# Patient Record
Sex: Female | Born: 1975 | Race: Black or African American | Hispanic: No | Marital: Single | Smoking: Never smoker
Health system: Southern US, Community
[De-identification: ages and names within clinical notes are randomized; demographics above are authoritative.]

---

## 2012-10-15 ENCOUNTER — Ambulatory Visit: Payer: Self-pay | Admitting: Unknown Physician Specialty

## 2012-10-31 ENCOUNTER — Ambulatory Visit: Payer: Self-pay | Admitting: Unknown Physician Specialty

## 2014-06-12 ENCOUNTER — Ambulatory Visit: Payer: Self-pay

## 2016-07-31 ENCOUNTER — Ambulatory Visit: Payer: Self-pay | Admitting: Physician Assistant

## 2016-07-31 VITALS — BP 110/70 | HR 86 | Temp 98.3°F

## 2016-07-31 DIAGNOSIS — N390 Urinary tract infection, site not specified: Secondary | ICD-10-CM

## 2016-07-31 DIAGNOSIS — R1032 Left lower quadrant pain: Secondary | ICD-10-CM

## 2016-07-31 MED ORDER — CIPROFLOXACIN HCL 250 MG PO TABS: 250.0000 mg | ORAL_TABLET | Freq: Two times a day (BID) | ORAL | 0 refills | Status: AC

## 2016-07-31 NOTE — Progress Notes (Signed)
S: c/o llq pain, no v/d, no fever/chills, no uti sx, no hx kidney stones, states pain started last night, worse with bending over, was a sharp stabbing pain when put pressure on this area, lmp was 1.5 weeks ago  O: vitals wnl, nad, lungs c t a, cv rrr, abd soft tender in llq closer to bladder/ovary, bs normal all 4 quads, ua 1+ leuks  A: uti  P: cipro 250mg  bid x 7d, if pain is increasing consider us of pelvis for ovarian cyst

## 2017-05-23 ENCOUNTER — Encounter: Payer: Self-pay | Admitting: Physician Assistant

## 2017-05-23 ENCOUNTER — Ambulatory Visit: Payer: Self-pay | Admitting: Physician Assistant

## 2017-05-23 VITALS — BP 120/82 | HR 73 | Temp 98.6°F

## 2017-05-23 DIAGNOSIS — J040 Acute laryngitis: Secondary | ICD-10-CM

## 2017-05-23 DIAGNOSIS — J069 Acute upper respiratory infection, unspecified: Secondary | ICD-10-CM

## 2017-05-23 MED ORDER — FLUTICASONE PROPIONATE 50 MCG/ACT NA SUSP: 2.0000 | Freq: Every day | NASAL | 6 refills | Status: AC

## 2017-05-23 MED ORDER — BENZONATATE 200 MG PO CAPS: 200.0000 mg | ORAL_CAPSULE | Freq: Three times a day (TID) | ORAL | 0 refills | Status: AC | PRN

## 2017-05-23 NOTE — Progress Notes (Signed)
S: Patient complains of a coarse voice, dry cough, no mucous production no sinus pain or drainage, denies fever or chills, denies chest pain or shortness of breath tried Alka-Seltzer plus over-the-counter without relief  O: Vitals are normal, voice is hoarse, TMs are clear nasal mucosa was swollen, throat is normal, neck is supple no lymph noted, lungs are clear to auscultation heart with normal heart sounds regular rate and rhythm  A: laryngitis, viral uri  P:flonase, tessalon perls, patient is to call the clinic and she is worsening by Monday and we will call in an antibiotic

## 2017-08-02 ENCOUNTER — Other Ambulatory Visit: Payer: Self-pay | Admitting: Unknown Physician Specialty

## 2017-08-02 DIAGNOSIS — M25562 Pain in left knee: Secondary | ICD-10-CM

## 2017-08-12 ENCOUNTER — Ambulatory Visit
Admission: RE | Admit: 2017-08-12 | Discharge: 2017-08-12 | Disposition: A | Discharge status: Home / Self Care | Payer: Managed Care, Other (non HMO) | Source: Ambulatory Visit | Attending: Unknown Physician Specialty | Admitting: Unknown Physician Specialty

## 2017-08-12 ENCOUNTER — Encounter: Payer: Self-pay | Admitting: Radiology

## 2017-08-12 DIAGNOSIS — M1712 Unilateral primary osteoarthritis, left knee: Secondary | ICD-10-CM | POA: Diagnosis not present

## 2017-08-12 DIAGNOSIS — M25562 Pain in left knee: Secondary | ICD-10-CM | POA: Insufficient documentation

## 2019-09-27 IMAGING — MR MR KNEE*L* W/O CM
6 series · 36 of 40 positions shown · non-contrast
Comparison: MRI 10/15/2012.

CLINICAL DATA: Suprapatellar pain for 1 month with intermittent
swelling. History ACL reconstruction.

EXAM:
MRI OF THE LEFT KNEE WITHOUT CONTRAST
TECHNIQUE: Multiplanar, multisequence MR imaging of the knee was performed. No
intravenous contrast was administered.

[Series 3: PD fat-sat · axial · 3.0mm · 0.50mm/px · z∈[-72,+46]mm · 8 of 37 slices shown (1 of 4)]
[im 1/37]
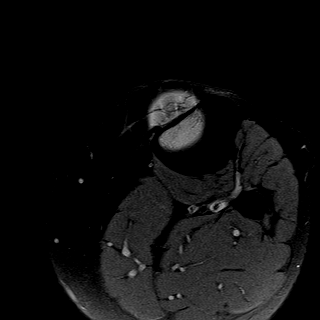
[im 6/37]
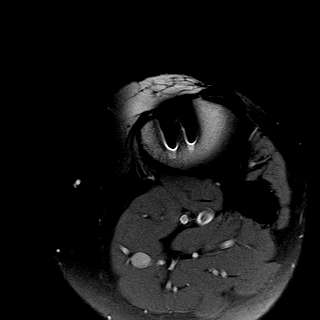
[im 11/37]
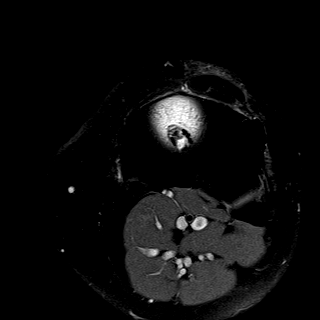
[im 16/37]
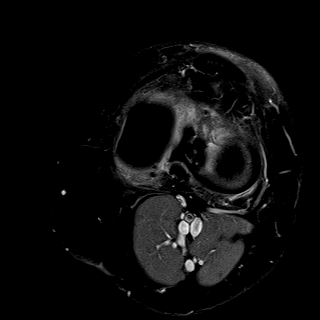
[im 21/37]
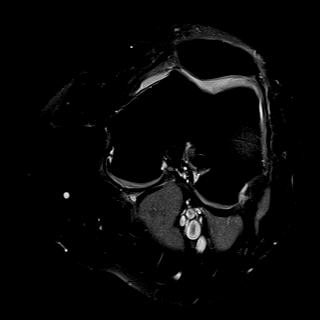
[im 26/37]
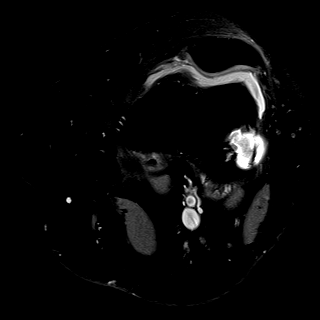
[im 31/37]
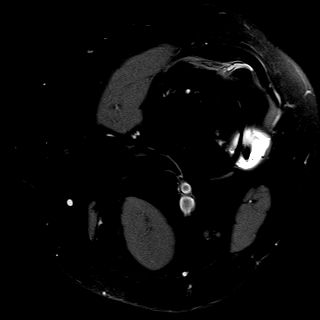
[im 37/37]
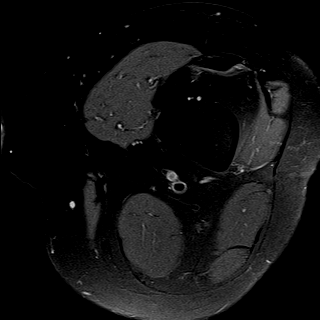

[Series 4: T1 · coronal · 3.0mm · 0.50mm/px · 3 of 31 slices shown]
[im 1/31]
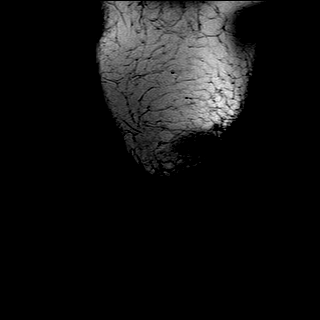
[im 6/31]
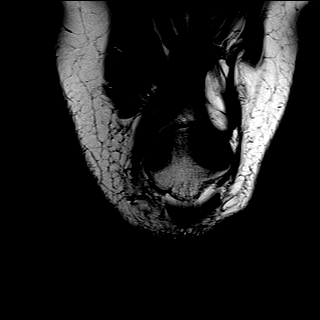
[im 11/31]
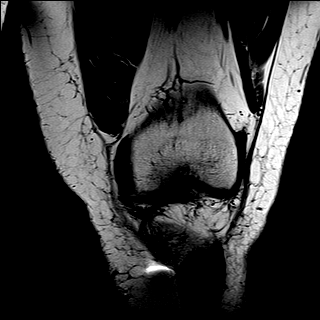

[Series 5: PD fat-sat · sagittal · 3.0mm · 0.50mm/px · 7 of 32 slices shown (2 of 4)]
[im 1/32]
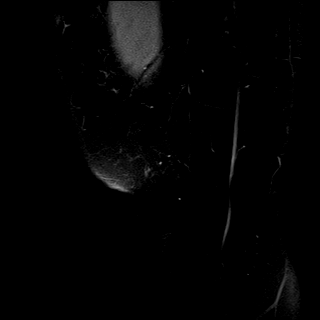
[im 6/32]
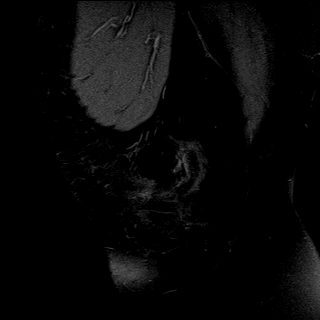
[im 11/32]
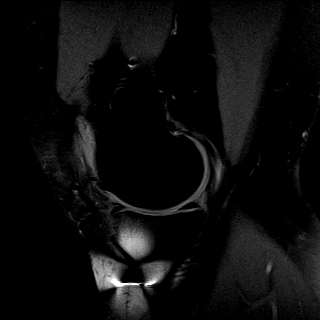
[im 16/32]
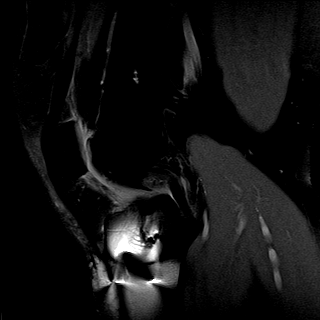
[im 21/32]
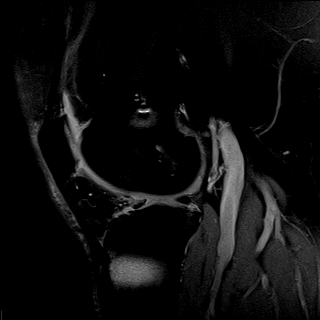
[im 26/32]
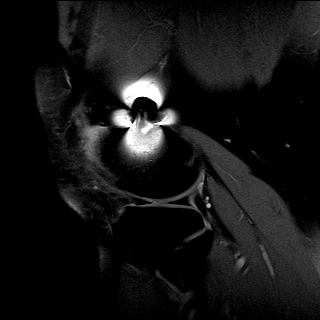
[im 32/32]
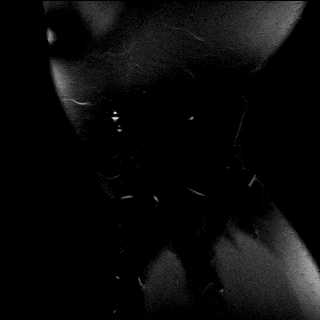

[Series 6: T2 fat-sat · coronal · 3.0mm · 0.31mm/px · 7 of 31 slices shown]
[im 1/31]
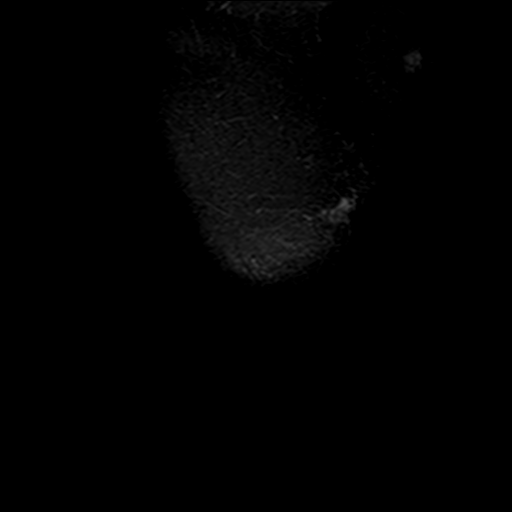
[im 6/31]
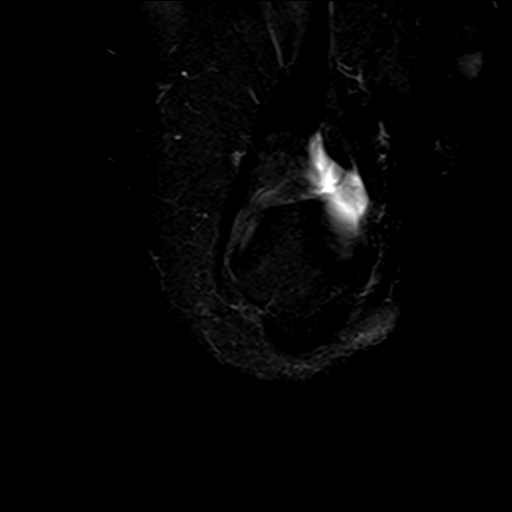
[im 11/31]
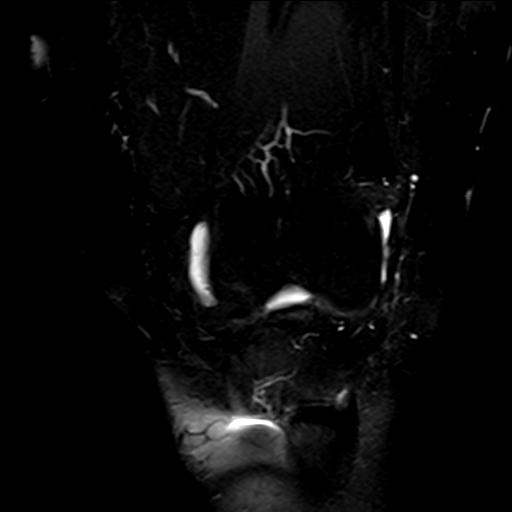
[im 16/31]
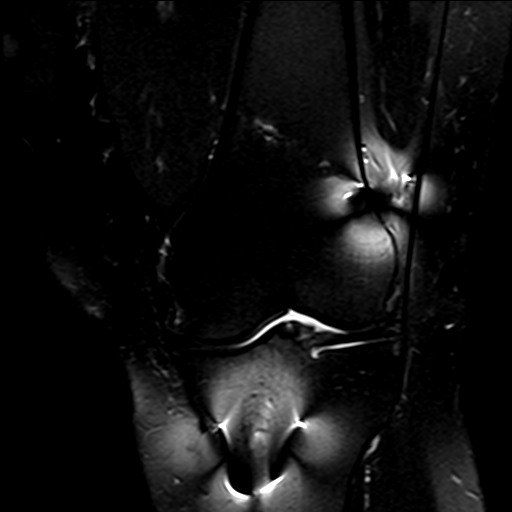
[im 21/31]
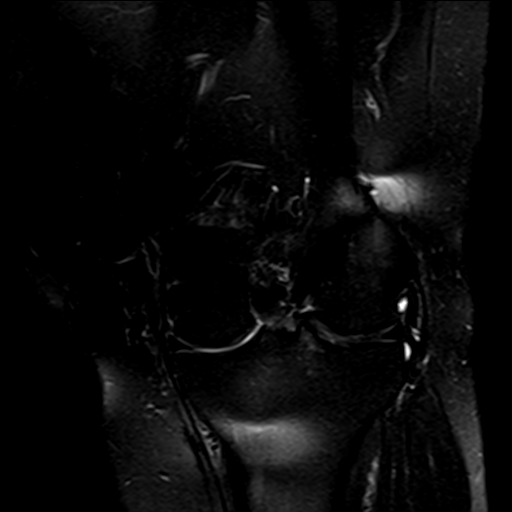
[im 26/31]
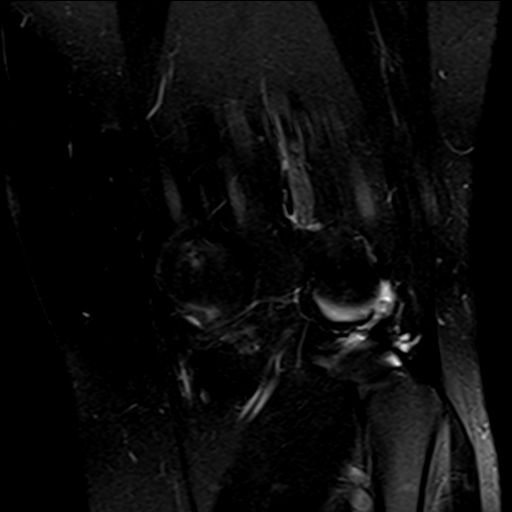
[im 31/31]
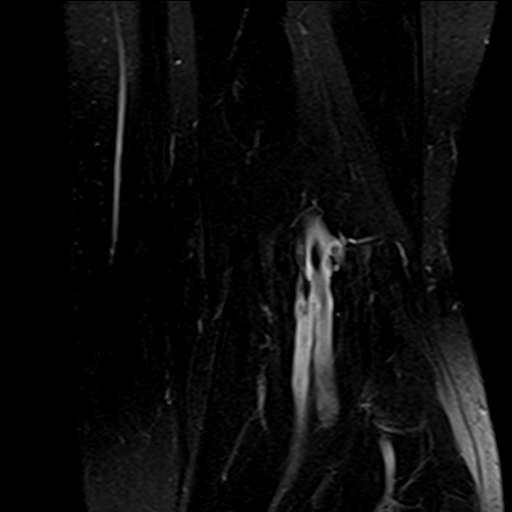

[Series 7: PD fat-sat · coronal · 3.0mm · 0.50mm/px · 7 of 31 slices shown (3 of 4)]
[im 1/31]
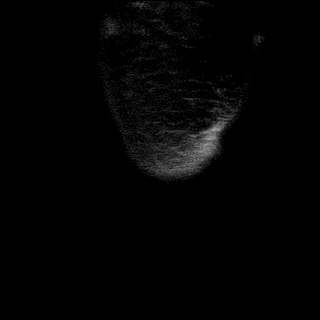
[im 6/31]
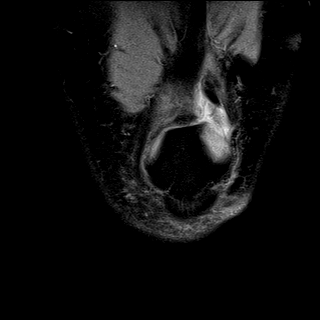
[im 11/31]
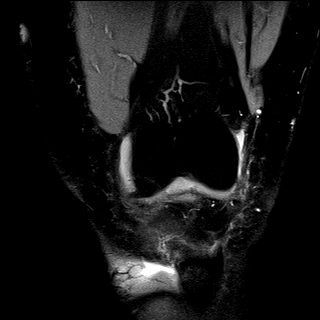
[im 16/31]
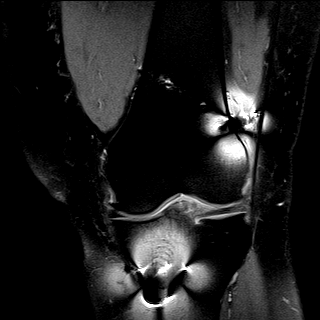
[im 21/31]
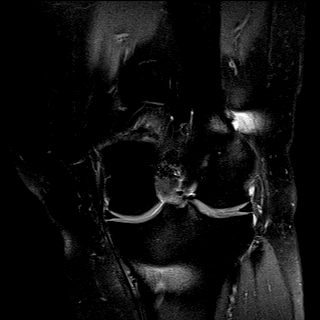
[im 26/31]
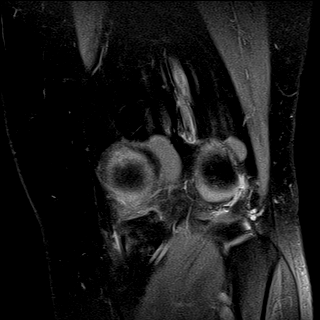
[im 31/31]
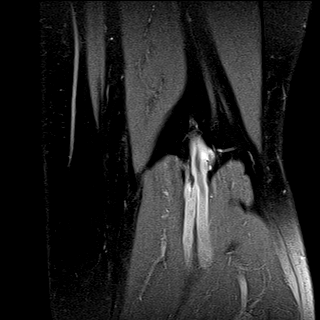

[Series 8: PD fat-sat · coronal · 2.0mm · 0.62mm/px · 4 of 18 slices shown (4 of 4)]
[im 1/18]
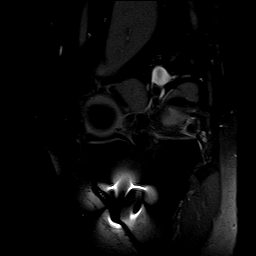
[im 6/18]
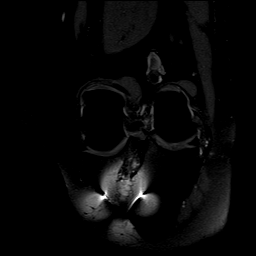
[im 12/18]
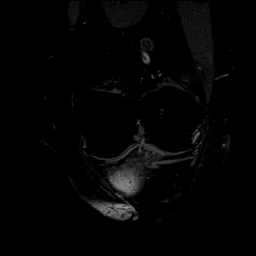
[im 18/18]
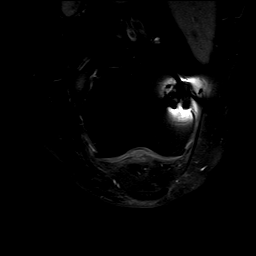

[36 of 40 positions shown; findings below may reference images not displayed]

FINDINGS: MENISCI

Medial meniscus:  Intact with normal morphology.

Lateral meniscus:  Intact with normal morphology.

LIGAMENTS

Cruciates: Status post ACL reconstruction. The graft is intact
without evidence roof impingement. Small tibial tunnel cyst has
decreased in size from the prior study. The PCL appears normal.

Collaterals:  Intact.

CARTILAGE

Patellofemoral: Progressive thinning of the patellar cartilage at
the apex with a nearly full-thickness chondral defect. No
significant subchondral cyst formation in the patella. There is mild
subchondral cyst formation in the femoral trochlea, greatest
medially.

Medial: Mild chondral thinning, surface irregularity and osteophyte
formation.

Lateral: Mild chondral thinning, surface irregularity and osteophyte
formation. Mild subchondral cyst formation posteriorly in the
lateral tibial plateau.

MISCELLANEOUS

Joint:  No significant joint effusion.

Popliteal Fossa:  Unremarkable. No significant Baker's cyst.

Extensor Mechanism: Intact. The patellar tendon does not appear to
have been utilized for the ACL reconstruction.

Bones:  No acute or significant extra-articular osseous findings.

Other: No significant periarticular soft tissue findings.
IMPRESSION: 1. Intact ACL graft status post ACL reconstruction.
2. Mildly progressive tricompartmental degenerative changes. No
acute osseous findings.
3. The menisci and collateral ligaments appear intact.
